# Patient Record
Sex: Male | Born: 1995 | Race: White | Hispanic: No | Marital: Single | Smoking: Never smoker
Health system: Southern US, Community
[De-identification: ages and names within clinical notes are randomized; demographics above are authoritative.]

---
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
## 2018-12-15 ENCOUNTER — Telehealth: Payer: Self-pay | Admitting: General Practice

## 2018-12-15 NOTE — Telephone Encounter (Signed)
Ria Bush, MD routed conversation to You Yesterday (9:30 AM)    Ria Bush, MD Yesterday (9:30 AM)     fyi pt's grandson may call to schedule new pt appointment.       Documentation     Ria Bush, MD  Bogue, Dulce Sellar (9:29 AM)     Mr Grissett, Sure it'd be my pleasure! Send [place] his name or have him call us a to schedule an appointment.  Have a good week, Dr Francoise Ceo, [place] routed conversation to Ria Bush, MD 2 days ago    [place], Fabian Sharp, MD 2 days ago     I am looking for primary health care for my 58 year old grandson. Could Dr Danise Mina take him? Thanks

## 2019-02-20 ENCOUNTER — Other Ambulatory Visit: Payer: Self-pay

## 2019-02-20 DIAGNOSIS — Z20822 Contact with and (suspected) exposure to covid-19: Secondary | ICD-10-CM

## 2019-02-21 LAB — INPATIENT

## 2019-02-21 LAB — NOVEL CORONAVIRUS, NAA: SARS-CoV-2, NAA: NOT DETECTED
